# Patient Record
Sex: Female | Born: 1965 | Race: White | Hispanic: No | Marital: Married | Smoking: Never smoker
Health system: Southern US, Community
[De-identification: ages and names within clinical notes are randomized; demographics above are authoritative.]

## PROBLEM LIST (undated history)

## (undated) DIAGNOSIS — I10 Essential (primary) hypertension: Secondary | ICD-10-CM

## (undated) DIAGNOSIS — B977 Papillomavirus as the cause of diseases classified elsewhere: Secondary | ICD-10-CM

## (undated) DIAGNOSIS — A630 Anogenital (venereal) warts: Secondary | ICD-10-CM

## (undated) DIAGNOSIS — C801 Malignant (primary) neoplasm, unspecified: Secondary | ICD-10-CM

## (undated) DIAGNOSIS — K649 Unspecified hemorrhoids: Secondary | ICD-10-CM

## (undated) HISTORY — DX: Anogenital (venereal) warts: A63.0

## (undated) HISTORY — DX: Papillomavirus as the cause of diseases classified elsewhere: B97.7

## (undated) HISTORY — DX: Unspecified hemorrhoids: K64.9

## (undated) HISTORY — PX: WISDOM TOOTH EXTRACTION: SHX21

## (undated) HISTORY — DX: Essential (primary) hypertension: I10

## (undated) HISTORY — PX: COLONOSCOPY: SHX174

---

## 2005-05-25 ENCOUNTER — Ambulatory Visit: Payer: Self-pay

## 2006-08-01 ENCOUNTER — Ambulatory Visit: Payer: Self-pay

## 2006-08-09 ENCOUNTER — Ambulatory Visit: Payer: Self-pay

## 2007-10-24 ENCOUNTER — Ambulatory Visit: Payer: Self-pay

## 2009-03-04 ENCOUNTER — Ambulatory Visit: Payer: Self-pay

## 2010-04-07 ENCOUNTER — Ambulatory Visit: Payer: Self-pay

## 2011-06-22 ENCOUNTER — Ambulatory Visit: Payer: Self-pay

## 2012-07-04 ENCOUNTER — Ambulatory Visit: Payer: Self-pay

## 2013-08-07 ENCOUNTER — Ambulatory Visit: Payer: Self-pay

## 2014-12-10 ENCOUNTER — Ambulatory Visit
Admit: 2014-12-10 | Disposition: A | Discharge status: Home / Self Care | Payer: Self-pay | Attending: Obstetrics and Gynecology | Admitting: Obstetrics and Gynecology

## 2016-02-11 DIAGNOSIS — I10 Essential (primary) hypertension: Secondary | ICD-10-CM | POA: Insufficient documentation

## 2016-12-19 ENCOUNTER — Other Ambulatory Visit: Payer: Self-pay | Admitting: Obstetrics and Gynecology

## 2016-12-19 DIAGNOSIS — Z1231 Encounter for screening mammogram for malignant neoplasm of breast: Secondary | ICD-10-CM

## 2017-02-01 ENCOUNTER — Ambulatory Visit
Admission: RE | Admit: 2017-02-01 | Discharge: 2017-02-01 | Disposition: A | Discharge status: Home / Self Care | Payer: Managed Care, Other (non HMO) | Source: Ambulatory Visit | Attending: Obstetrics and Gynecology | Admitting: Obstetrics and Gynecology

## 2017-02-01 ENCOUNTER — Encounter: Payer: Self-pay | Admitting: Obstetrics and Gynecology

## 2017-02-01 DIAGNOSIS — Z1231 Encounter for screening mammogram for malignant neoplasm of breast: Secondary | ICD-10-CM | POA: Diagnosis not present

## 2017-03-05 HISTORY — PX: COLONOSCOPY: SHX174

## 2017-09-05 ENCOUNTER — Other Ambulatory Visit: Payer: Self-pay

## 2017-09-05 ENCOUNTER — Telehealth: Payer: Self-pay | Admitting: Obstetrics and Gynecology

## 2017-09-05 MED ORDER — NORETHINDRONE 0.35 MG PO TABS: 1.0000 | ORAL_TABLET | Freq: Every day | ORAL | 0 refills | Status: DC

## 2017-09-05 NOTE — Telephone Encounter (Signed)
Done

## 2017-09-05 NOTE — Telephone Encounter (Signed)
Patient needs refill on bc, she will run out by the end of the week, she is scheduled for annual 2/28.  [facility] [place].  She is also requesting 3 month Rx as it is cheaper for her.

## 2017-09-25 ENCOUNTER — Other Ambulatory Visit: Payer: Self-pay | Admitting: Obstetrics and Gynecology

## 2017-09-25 MED ORDER — NORETHINDRONE 0.35 MG PO TABS: 1.0000 | ORAL_TABLET | Freq: Every day | ORAL | 0 refills | Status: DC

## 2017-09-27 ENCOUNTER — Other Ambulatory Visit: Payer: Self-pay

## 2017-09-27 MED ORDER — NORETHINDRONE 0.35 MG PO TABS: 1.0000 | ORAL_TABLET | Freq: Every day | ORAL | 0 refills | Status: DC

## 2017-09-27 NOTE — Telephone Encounter (Signed)
Done

## 2017-09-27 NOTE — Telephone Encounter (Signed)
Patient's annual is rescheduled from 2/28 due to our office to 3/28.  Patient does not have enough of her birth control to last until this appointment.  Please call in three month prescription.  She has a week left now.  cb [phone].  [facility] [place]

## 2017-10-25 ENCOUNTER — Ambulatory Visit: Payer: Self-pay | Admitting: Obstetrics and Gynecology

## 2017-11-22 ENCOUNTER — Ambulatory Visit: Payer: Self-pay | Admitting: Obstetrics and Gynecology

## 2017-11-22 ENCOUNTER — Ambulatory Visit (INDEPENDENT_AMBULATORY_CARE_PROVIDER_SITE_OTHER): Payer: Managed Care, Other (non HMO) | Admitting: Obstetrics and Gynecology

## 2017-11-22 ENCOUNTER — Encounter: Payer: Self-pay | Admitting: Obstetrics and Gynecology

## 2017-11-22 VITALS — BP 120/80 | Ht 67.0 in | Wt 192.0 lb

## 2017-11-22 DIAGNOSIS — Z3041 Encounter for surveillance of contraceptive pills: Secondary | ICD-10-CM

## 2017-11-22 DIAGNOSIS — Z1239 Encounter for other screening for malignant neoplasm of breast: Secondary | ICD-10-CM

## 2017-11-22 DIAGNOSIS — Z01419 Encounter for gynecological examination (general) (routine) without abnormal findings: Secondary | ICD-10-CM | POA: Diagnosis not present

## 2017-11-22 DIAGNOSIS — Z1231 Encounter for screening mammogram for malignant neoplasm of breast: Secondary | ICD-10-CM

## 2017-11-22 MED ORDER — NORETHINDRONE 0.35 MG PO TABS: 1.0000 | ORAL_TABLET | Freq: Every day | ORAL | 3 refills | Status: DC

## 2017-11-22 NOTE — Progress Notes (Signed)
PCP: Deborah Chalk, FNP   Chief Complaint  Patient presents with  . Gynecologic Exam    HPI:      Ms. Ruth Bennett is a 52 y.o. G0P0000 who LMP was No LMP recorded., presents today for her annual examination.  Her menses are infrequent, light, lasting a few days on OCPs.  Dysmenorrhea none. She does not have intermenstrual bleeding.  Sex activity: single partner, contraception - oral progesterone-only contraceptive. She does not have vaginal dryness.  Last Pap: October 05, 2016  Results were: no abnormalities /neg HPV DNA.  Hx of STDs: HPV on pap 2017  Last mammogram: February 01, 2017  Results were: normal--routine follow-up in 12 months There is no FH of breast cancer. There is no FH of ovarian cancer. The patient does do self-breast exams.  Colonoscopy: colonoscopy 1 years ago without abnormalities.  Repeat due after 5 years.   Tobacco use: The patient denies current or previous tobacco use. Alcohol use: none Exercise: moderately active  She does get adequate calcium and Vitamin D in her diet.  Labs with PCP.   Past Medical History:  Diagnosis Date  . Genital warts   . Hemorrhoids   . HPV (human papilloma virus) infection   . Hypertension     Past Surgical History:  Procedure Laterality Date  . COLONOSCOPY    . WISDOM TOOTH EXTRACTION      Family History  Problem Relation Age of Onset  . Transient ischemic attack Mother   . Hypertension Father   . Hypertension Sister   . Thyroid cancer Sister   . Colon cancer Paternal Grandmother   . Stomach cancer Paternal Grandmother     Social History   Socioeconomic History  . Marital status: Married    Spouse name: Not on file  . Number of children: Not on file  . Years of education: Not on file  . Highest education level: Not on file  Occupational History  . Not on file  Social Needs  . Financial resource strain: Not on file  . Food insecurity:    Worry: Not on file    Inability: Not on file  .  Transportation needs:    Medical: Not on file    Non-medical: Not on file  Tobacco Use  . Smoking status: Never Smoker  . Smokeless tobacco: Never Used  Substance and Sexual Activity  . Alcohol use: Yes  . Drug use: Never  . Sexual activity: Yes    Birth control/protection: Pill  Lifestyle  . Physical activity:    Days per week: Not on file    Minutes per session: Not on file  . Stress: Not on file  Relationships  . Social connections:    Talks on phone: Not on file    Gets together: Not on file    Attends religious service: Not on file    Active member of club or organization: Not on file    Attends meetings of clubs or organizations: Not on file    Relationship status: Not on file  . Intimate partner violence:    Fear of current or ex partner: Not on file    Emotionally abused: Not on file    Physically abused: Not on file    Forced sexual activity: Not on file  Other Topics Concern  . Not on file  Social History Narrative  . Not on file    Outpatient Medications Prior to Visit  Medication Sig Dispense Refill  .  norethindrone (MICRONOR,CAMILA,ERRIN) 0.35 MG tablet Take 1 tablet (0.35 mg total) by mouth daily. 3 Package 0   No facility-administered medications prior to visit.      ROS:  Review of Systems  Constitutional: Negative for fatigue, fever and unexpected weight change.  Respiratory: Negative for cough, shortness of breath and wheezing.   Cardiovascular: Negative for chest pain, palpitations and leg swelling.  Gastrointestinal: Negative for blood in stool, constipation, diarrhea, nausea and vomiting.  Endocrine: Negative for cold intolerance, heat intolerance and polyuria.  Genitourinary: Negative for dyspareunia, dysuria, flank pain, frequency, genital sores, hematuria, menstrual problem, pelvic pain, urgency, vaginal bleeding, vaginal discharge and vaginal pain.  Musculoskeletal: Negative for back pain, joint swelling and myalgias.  Skin: Negative for  rash.  Neurological: Negative for dizziness, syncope, light-headedness, numbness and headaches.  Hematological: Negative for adenopathy.  Psychiatric/Behavioral: Negative for agitation, confusion, sleep disturbance and suicidal ideas. The patient is not nervous/anxious.    BREAST: No symptoms    Objective: BP 120/80   Ht 5\' 7"  (1.702 m)   Wt 192 lb (87.1 kg)   BMI 30.07 kg/m    Physical Exam  Constitutional: She is oriented to person, place, and time. She appears well-developed and well-nourished.  Genitourinary: Vagina normal and uterus normal. There is no rash or tenderness on the right labia. There is no rash or tenderness on the left labia. No erythema or tenderness in the vagina. No vaginal discharge found. Right adnexum does not display mass and does not display tenderness. Left adnexum does not display mass and does not display tenderness. Cervix does not exhibit motion tenderness or polyp. Uterus is not enlarged or tender.  Neck: Normal range of motion. No thyromegaly present.  Cardiovascular: Normal rate, regular rhythm and normal heart sounds.  No murmur heard. Pulmonary/Chest: Effort normal and breath sounds normal. Right breast exhibits no mass, no nipple discharge, no skin change and no tenderness. Left breast exhibits no mass, no nipple discharge, no skin change and no tenderness.  Abdominal: Soft. There is no tenderness. There is no guarding.  Musculoskeletal: Normal range of motion.  Neurological: She is alert and oriented to person, place, and time. No cranial nerve deficit.  Psychiatric: She has a normal mood and affect. Her behavior is normal.  Vitals reviewed.   Assessment/Plan:  Encounter for annual routine gynecological examination  Screening for breast cancer - pt to sched mammo. - Plan: MM DIGITAL SCREENING BILATERAL  Encounter for surveillance of contraceptive pills - Rx RF camila. Still cycling occas. Re-eval in 1 yr. - Plan: norethindrone  (MICRONOR,CAMILA,ERRIN) 0.35 MG tablet   Meds ordered this encounter  Medications  . norethindrone (MICRONOR,CAMILA,ERRIN) 0.35 MG tablet    Sig: Take 1 tablet (0.35 mg total) by mouth daily.    Dispense:  3 Package    Refill:  3    Order Specific Question:   Supervising Provider    Answer:   Gae Dry [[id number]]           GYN counsel breast self exam, mammography screening, menopause, adequate intake of calcium and vitamin D, diet and exercise    F/U  No follow-ups on file.  Keara Pagliarulo B. Kern Gingras, PA-C 11/22/2017 3:42 PM

## 2017-11-22 NOTE — Patient Instructions (Addendum)
I value your feedback and entrusting us with your care. If you get a [facility] patient survey, I would appreciate you taking the time to let us know about your experience today. Thank you!  [facility]: [phone]    

## 2017-12-17 ENCOUNTER — Encounter: Payer: Self-pay | Admitting: Obstetrics and Gynecology

## 2017-12-17 ENCOUNTER — Telehealth: Payer: Self-pay

## 2017-12-17 NOTE — Telephone Encounter (Signed)
Called pt to let her know [facility] [place] to inquire about the pt saying there is no prescription to be picked up[facility]. I let her know in the voicemail to call if there was any trouble with trying to get that prescription picked up.

## 2018-05-02 ENCOUNTER — Encounter (HOSPITAL_COMMUNITY): Payer: Self-pay

## 2018-05-02 ENCOUNTER — Ambulatory Visit
Admission: RE | Admit: 2018-05-02 | Discharge: 2018-05-02 | Disposition: A | Discharge status: Home / Self Care | Payer: Managed Care, Other (non HMO) | Source: Ambulatory Visit | Attending: Obstetrics and Gynecology | Admitting: Obstetrics and Gynecology

## 2018-05-02 DIAGNOSIS — Z1231 Encounter for screening mammogram for malignant neoplasm of breast: Secondary | ICD-10-CM | POA: Diagnosis not present

## 2018-05-02 DIAGNOSIS — Z1239 Encounter for other screening for malignant neoplasm of breast: Secondary | ICD-10-CM

## 2018-05-02 HISTORY — DX: Malignant (primary) neoplasm, unspecified: C80.1

## 2018-05-05 ENCOUNTER — Encounter: Payer: Self-pay | Admitting: Obstetrics and Gynecology

## 2018-11-06 ENCOUNTER — Other Ambulatory Visit: Payer: Self-pay | Admitting: Obstetrics and Gynecology

## 2018-11-06 DIAGNOSIS — Z3041 Encounter for surveillance of contraceptive pills: Secondary | ICD-10-CM

## 2018-11-08 ENCOUNTER — Other Ambulatory Visit: Payer: Self-pay | Admitting: Obstetrics and Gynecology

## 2018-11-08 DIAGNOSIS — Z3041 Encounter for surveillance of contraceptive pills: Secondary | ICD-10-CM

## 2018-11-14 ENCOUNTER — Other Ambulatory Visit: Payer: Self-pay

## 2018-11-14 ENCOUNTER — Telehealth: Payer: Self-pay | Admitting: Obstetrics and Gynecology

## 2018-11-14 DIAGNOSIS — Z3041 Encounter for surveillance of contraceptive pills: Secondary | ICD-10-CM

## 2018-11-14 MED ORDER — NORETHINDRONE 0.35 MG PO TABS: 1.0000 | ORAL_TABLET | Freq: Every day | ORAL | 0 refills | Status: DC

## 2018-11-14 NOTE — Telephone Encounter (Signed)
Patient is messaging thru mychart needing to reschedule to late May and is needing refills on Birthcontrol to get her to her next schedule appointment on 01/16/19.

## 2018-11-14 NOTE — Telephone Encounter (Signed)
RF sent.

## 2018-11-28 ENCOUNTER — Ambulatory Visit: Payer: Managed Care, Other (non HMO) | Admitting: Obstetrics and Gynecology

## 2019-01-15 ENCOUNTER — Ambulatory Visit: Payer: Managed Care, Other (non HMO) | Admitting: Obstetrics and Gynecology

## 2019-01-16 ENCOUNTER — Ambulatory Visit: Payer: Managed Care, Other (non HMO) | Admitting: Obstetrics and Gynecology

## 2019-02-04 ENCOUNTER — Other Ambulatory Visit: Payer: Self-pay | Admitting: Obstetrics and Gynecology

## 2019-02-04 DIAGNOSIS — Z3041 Encounter for surveillance of contraceptive pills: Secondary | ICD-10-CM

## 2019-02-12 ENCOUNTER — Ambulatory Visit: Payer: Managed Care, Other (non HMO) | Admitting: Obstetrics and Gynecology

## 2019-03-20 ENCOUNTER — Ambulatory Visit: Payer: Managed Care, Other (non HMO) | Admitting: Obstetrics and Gynecology

## 2019-04-23 ENCOUNTER — Other Ambulatory Visit: Payer: Self-pay

## 2019-04-23 ENCOUNTER — Other Ambulatory Visit (HOSPITAL_COMMUNITY)
Admission: RE | Admit: 2019-04-23 | Discharge: 2019-04-23 | Disposition: A | Discharge status: Home / Self Care | Payer: Managed Care, Other (non HMO) | Source: Ambulatory Visit | Attending: Obstetrics and Gynecology | Admitting: Obstetrics and Gynecology

## 2019-04-23 ENCOUNTER — Ambulatory Visit (INDEPENDENT_AMBULATORY_CARE_PROVIDER_SITE_OTHER): Payer: Managed Care, Other (non HMO) | Admitting: Obstetrics and Gynecology

## 2019-04-23 ENCOUNTER — Encounter: Payer: Self-pay | Admitting: Obstetrics and Gynecology

## 2019-04-23 VITALS — BP 130/100 | Ht 67.0 in | Wt 195.6 lb

## 2019-04-23 DIAGNOSIS — Z1239 Encounter for other screening for malignant neoplasm of breast: Secondary | ICD-10-CM

## 2019-04-23 DIAGNOSIS — Z124 Encounter for screening for malignant neoplasm of cervix: Secondary | ICD-10-CM | POA: Insufficient documentation

## 2019-04-23 DIAGNOSIS — Z8742 Personal history of other diseases of the female genital tract: Secondary | ICD-10-CM

## 2019-04-23 DIAGNOSIS — Z01419 Encounter for gynecological examination (general) (routine) without abnormal findings: Secondary | ICD-10-CM | POA: Diagnosis not present

## 2019-04-23 DIAGNOSIS — Z1151 Encounter for screening for human papillomavirus (HPV): Secondary | ICD-10-CM | POA: Diagnosis present

## 2019-04-23 DIAGNOSIS — N951 Menopausal and female climacteric states: Secondary | ICD-10-CM

## 2019-04-23 NOTE — Progress Notes (Signed)
PCP: Deborah Chalk, FNP   Chief Complaint  Patient presents with  . Gynecologic Exam    HPI:      Ms. Ruth Bennett is a 53 y.o. G0P0000 who LMP was No LMP recorded. (Menstrual status: Oral contraceptives)., presents today for her annual examination.  Her menses are infrequent,spotting only, a few days on POPs. Started [facility] for menorrhagia in past. Dysmenorrhea none. She does not have intermenstrual bleeding.  Sex activity: single partner, contraception - oral progesterone-only contraceptive. She does have vaginal dryness, improved with lubricants.  Last Pap: October 05, 2016  Results were: no abnormalities /neg HPV DNA.  Hx of STDs: HPV on pap 2017  Last mammogram: 05/02/18  Results were: normal--routine follow-up in 12 months There is no FH of breast cancer. There is no FH of ovarian cancer. The patient does do self-breast exams.  Colonoscopy: colonoscopy 2 years ago without abnormalities.  Repeat due after 5 years. FH colon cancer in her PGM.  Tobacco use: The patient denies current or previous tobacco use. Alcohol use: social Drug use: none Exercise: moderately active  She does get adequate calcium and Vitamin D in her diet.  Labs with PCP.   Past Medical History:  Diagnosis Date  . Cancer ([place])    skin  . Genital warts   . Hemorrhoids   . HPV (human papilloma virus) infection   . Hypertension     Past Surgical History:  Procedure Laterality Date  . COLONOSCOPY    . WISDOM TOOTH EXTRACTION      Family History  Problem Relation Age of Onset  . Transient ischemic attack Mother   . Hypertension Father   . Hypertension Sister   . Thyroid cancer Sister   . Colon cancer Paternal Grandmother   . Stomach cancer Paternal Grandmother   . Breast cancer Neg Hx     Social History   Socioeconomic History  . Marital status: Married    Spouse name: Not on file  . Number of children: Not on file  . Years of education: Not on file  . Highest education  level: Not on file  Occupational History  . Not on file  Social Needs  . Financial resource strain: Not on file  . Food insecurity    Worry: Not on file    Inability: Not on file  . Transportation needs    Medical: Not on file    Non-medical: Not on file  Tobacco Use  . Smoking status: Never Smoker  . Smokeless tobacco: Never Used  Substance and Sexual Activity  . Alcohol use: Yes  . Drug use: Never  . Sexual activity: Yes    Birth control/protection: Pill  Lifestyle  . Physical activity    Days per week: Not on file    Minutes per session: Not on file  . Stress: Not on file  Relationships  . Social Herbalist on phone: Not on file    Gets together: Not on file    Attends religious service: Not on file    Active member of club or organization: Not on file    Attends meetings of clubs or organizations: Not on file    Relationship status: Not on file  . Intimate partner violence    Fear of current or ex partner: Not on file    Emotionally abused: Not on file    Physically abused: Not on file    Forced sexual activity: Not on file  Other  Topics Concern  . Not on file  Social History Narrative  . Not on file    Outpatient Medications Prior to Visit  Medication Sig Dispense Refill  . metoprolol succinate (TOPROL-XL) 25 MG 24 hr tablet TAKE 1 TABLET BY MOUTH DAILY    . norethindrone (MICRONOR) 0.35 MG tablet TAKE 1 TABLET BY MOUTH ONCE A DAY 84 tablet 0   No facility-administered medications prior to visit.      ROS:  Review of Systems  Constitutional: Negative for fatigue, fever and unexpected weight change.  Respiratory: Negative for cough, shortness of breath and wheezing.   Cardiovascular: Negative for chest pain, palpitations and leg swelling.  Gastrointestinal: Negative for blood in stool, constipation, diarrhea, nausea and vomiting.  Endocrine: Negative for cold intolerance, heat intolerance and polyuria.  Genitourinary: Negative for dyspareunia,  dysuria, flank pain, frequency, genital sores, hematuria, menstrual problem, pelvic pain, urgency, vaginal bleeding, vaginal discharge and vaginal pain.  Musculoskeletal: Negative for back pain, joint swelling and myalgias.  Skin: Negative for rash.  Neurological: Negative for dizziness, syncope, light-headedness, numbness and headaches.  Hematological: Negative for adenopathy.  Psychiatric/Behavioral: Negative for agitation, confusion, sleep disturbance and suicidal ideas. The patient is not nervous/anxious.   BREAST: No symptoms    Objective: BP (!) 130/100   Ht 5\' 7"  (1.702 m)   Wt 195 lb 9.6 oz (88.7 kg)   BMI 30.64 kg/m    Physical Exam Constitutional:      Appearance: She is well-developed.  Genitourinary:     Vulva, vagina, uterus, right adnexa and left adnexa normal.     No vulval lesion or tenderness noted.     No vaginal discharge, erythema or tenderness.     No cervical motion tenderness or polyp.     Uterus is not enlarged or tender.     No right or left adnexal mass present.     Right adnexa not tender.     Left adnexa not tender.  Neck:     Musculoskeletal: Normal range of motion.     Thyroid: No thyromegaly.  Cardiovascular:     Rate and Rhythm: Normal rate and regular rhythm.     Heart sounds: Normal heart sounds. No murmur.  Pulmonary:     Effort: Pulmonary effort is normal.     Breath sounds: Normal breath sounds.  Chest:     Breasts:        Right: No mass, nipple discharge, skin change or tenderness.        Left: No mass, nipple discharge, skin change or tenderness.  Abdominal:     Palpations: Abdomen is soft.     Tenderness: There is no abdominal tenderness. There is no guarding.  Musculoskeletal: Normal range of motion.  Neurological:     General: No focal deficit present.     Mental Status: She is alert and oriented to person, place, and time.     Cranial Nerves: No cranial nerve deficit.  Skin:    General: Skin is warm and dry.  Psychiatric:         Mood and Affect: Mood normal.        Behavior: Behavior normal.        Thought Content: Thought content normal.        Judgment: Judgment normal.  Vitals signs reviewed.     Assessment/Plan:  Encounter for annual routine gynecological examination  Cervical cancer screening - Plan: Cytology - PAP  Screening for HPV (human papillomavirus) - Plan: Cytology - PAP  History of abnormal cervical Pap smear - Plan: Cytology - PAP  Screening for breast cancer - Plan: MM 3D SCREEN BREAST BILATERAL--Pt to sched mammo  Perimenopause--Discussed stopping POPs since bleeding so infrequent. F/u prn DUB or menorrhagia for POP restart.          GYN counsel breast self exam, mammography screening, menopause, adequate intake of calcium and vitamin D, diet and exercise    F/U  Return in about 1 year (around 04/22/2020).  Kayana Thoen B. Darien Kading, PA-C 04/23/2019 2:00 PM

## 2019-04-23 NOTE — Patient Instructions (Addendum)
I value your feedback and entrusting us with your care. If you get a [facility] patient survey, I would appreciate you taking the time to let us know about your experience today. Thank you!  [facility]: [phone]    

## 2019-04-28 LAB — CYTOLOGY - PAP
Adequacy: ABSENT
Diagnosis: NEGATIVE
HPV: NOT DETECTED

## 2019-05-13 ENCOUNTER — Other Ambulatory Visit: Payer: Self-pay | Admitting: Obstetrics and Gynecology

## 2019-05-13 DIAGNOSIS — Z3041 Encounter for surveillance of contraceptive pills: Secondary | ICD-10-CM

## 2019-05-13 MED ORDER — NORETHINDRONE 0.35 MG PO TABS: 1.0000 | ORAL_TABLET | Freq: Every day | ORAL | 0 refills | Status: DC

## 2019-05-13 NOTE — Telephone Encounter (Signed)
Please see

## 2019-05-14 ENCOUNTER — Other Ambulatory Visit: Payer: Self-pay | Admitting: Obstetrics and Gynecology

## 2019-05-14 ENCOUNTER — Encounter: Payer: Self-pay | Admitting: Obstetrics and Gynecology

## 2019-05-14 DIAGNOSIS — Z3041 Encounter for surveillance of contraceptive pills: Secondary | ICD-10-CM

## 2019-06-04 ENCOUNTER — Other Ambulatory Visit: Payer: Self-pay | Admitting: Obstetrics and Gynecology

## 2019-06-04 DIAGNOSIS — Z3041 Encounter for surveillance of contraceptive pills: Secondary | ICD-10-CM

## 2019-10-09 ENCOUNTER — Encounter: Payer: Self-pay | Admitting: Obstetrics and Gynecology

## 2019-10-09 ENCOUNTER — Ambulatory Visit
Admission: RE | Admit: 2019-10-09 | Discharge: 2019-10-09 | Disposition: A | Discharge status: Home / Self Care | Payer: Managed Care, Other (non HMO) | Source: Ambulatory Visit | Attending: Obstetrics and Gynecology | Admitting: Obstetrics and Gynecology

## 2019-10-09 DIAGNOSIS — Z1239 Encounter for other screening for malignant neoplasm of breast: Secondary | ICD-10-CM

## 2019-10-09 DIAGNOSIS — Z1231 Encounter for screening mammogram for malignant neoplasm of breast: Secondary | ICD-10-CM | POA: Diagnosis present

## 2019-12-15 ENCOUNTER — Encounter: Payer: Self-pay | Admitting: Obstetrics and Gynecology

## 2020-02-05 DIAGNOSIS — K219 Gastro-esophageal reflux disease without esophagitis: Secondary | ICD-10-CM | POA: Insufficient documentation

## 2020-06-16 ENCOUNTER — Other Ambulatory Visit: Payer: Self-pay | Admitting: Obstetrics and Gynecology

## 2020-06-16 DIAGNOSIS — Z1231 Encounter for screening mammogram for malignant neoplasm of breast: Secondary | ICD-10-CM

## 2020-08-11 NOTE — Progress Notes (Signed)
PCP: Deborah Chalk, FNP   Chief Complaint  Patient presents with  . Gynecologic Exam    No concerns    HPI:      Ms. Ruth Bennett is a 54 y.o. G0P0000 who LMP was No LMP recorded (lmp unknown). (Menstrual status: Oral contraceptives)., presents today for her annual examination.  Her menses are absent since stopping POPs last yr. No BTB, no PMB. No dysmen.  Sex activity: single partner, contraception - post menopause. She does have vaginal dryness, improved with lubricants/coconut oil.  Last Pap: 04/23/19  Results were: no abnormalities /neg HPV DNA. Due next yr Hx of STDs: HPV on pap 2017  Last mammogram: 10/09/19  Results were: normal--routine follow-up in 12 months. Has appt sched 2/22 There is no FH of breast cancer. There is no FH of ovarian cancer. The patient does do self-breast exams.  Colonoscopy: colonoscopy 3 years ago without abnormalities.  Repeat due after 5 years. FH colon cancer in her PGM.  Tobacco use: The patient denies current or previous tobacco use. Alcohol use: social Drug use: none Exercise: moderately active  She does get adequate calcium and Vitamin D in her diet.  Labs with PCP.   Past Medical History:  Diagnosis Date  . Cancer ([place])    skin  . Genital warts   . Hemorrhoids   . HPV (human papilloma virus) infection   . Hypertension     Past Surgical History:  Procedure Laterality Date  . COLONOSCOPY    . WISDOM TOOTH EXTRACTION      Family History  Problem Relation Age of Onset  . Transient ischemic attack Mother   . Hypertension Father   . Hypertension Sister   . Thyroid cancer Sister   . Colon cancer Paternal Grandmother   . Stomach cancer Paternal Grandmother   . Breast cancer Neg Hx     Social History   Socioeconomic History  . Marital status: Married    Spouse name: Not on file  . Number of children: Not on file  . Years of education: Not on file  . Highest education level: Not on file  Occupational History   . Not on file  Tobacco Use  . Smoking status: Never Smoker  . Smokeless tobacco: Never Used  Vaping Use  . Vaping Use: Never used  Substance and Sexual Activity  . Alcohol use: Yes  . Drug use: Never  . Sexual activity: Yes    Birth control/protection: None  Other Topics Concern  . Not on file  Social History Narrative  . Not on file   Social Determinants of Health   Financial Resource Strain: Not on file  Food Insecurity: Not on file  Transportation Needs: Not on file  Physical Activity: Not on file  Stress: Not on file  Social Connections: Not on file  Intimate Partner Violence: Not on file    Outpatient Medications Prior to Visit  Medication Sig Dispense Refill  . metoprolol succinate (TOPROL-XL) 50 MG 24 hr tablet Take 50 mg by mouth daily.    . metoprolol succinate (TOPROL-XL) 25 MG 24 hr tablet TAKE 1 TABLET BY MOUTH DAILY    . norethindrone (MICRONOR) 0.35 MG tablet Take 1 tablet (0.35 mg total) by mouth daily. 28 tablet 0   No facility-administered medications prior to visit.     ROS:  Review of Systems  Constitutional: Negative for fatigue, fever and unexpected weight change.  Respiratory: Negative for cough, shortness of breath and wheezing.  Cardiovascular: Negative for chest pain, palpitations and leg swelling.  Gastrointestinal: Negative for blood in stool, constipation, diarrhea, nausea and vomiting.  Endocrine: Negative for cold intolerance, heat intolerance and polyuria.  Genitourinary: Negative for dyspareunia, dysuria, flank pain, frequency, genital sores, hematuria, menstrual problem, pelvic pain, urgency, vaginal bleeding, vaginal discharge and vaginal pain.  Musculoskeletal: Negative for back pain, joint swelling and myalgias.  Skin: Negative for rash.  Neurological: Negative for dizziness, syncope, light-headedness, numbness and headaches.  Hematological: Negative for adenopathy.  Psychiatric/Behavioral: Negative for agitation, confusion,  sleep disturbance and suicidal ideas. The patient is not nervous/anxious.   BREAST: No symptoms    Objective: BP 126/80   Ht 5\' 7"  (1.702 m)   Wt 199 lb (90.3 kg)   LMP  (LMP Unknown)   BMI 31.17 kg/m    Physical Exam Constitutional:      Appearance: She is well-developed.  Genitourinary:     Vulva normal.     Right Labia: No rash, tenderness or lesions.    Left Labia: No tenderness, lesions or rash.    No vaginal discharge, erythema or tenderness.      Right Adnexa: not tender and no mass present.    Left Adnexa: not tender and no mass present.    No cervical motion tenderness, friability or polyp.     Uterus is not enlarged or tender.  Breasts:     Right: No mass, nipple discharge, skin change or tenderness.     Left: No mass, nipple discharge, skin change or tenderness.    Neck:     Thyroid: No thyromegaly.  Cardiovascular:     Rate and Rhythm: Normal rate and regular rhythm.     Heart sounds: Normal heart sounds. No murmur heard.   Pulmonary:     Effort: Pulmonary effort is normal.     Breath sounds: Normal breath sounds.  Abdominal:     Palpations: Abdomen is soft.     Tenderness: There is no abdominal tenderness. There is no guarding or rebound.  Musculoskeletal:        General: Normal range of motion.     Cervical back: Normal range of motion.  Lymphadenopathy:     Cervical: No cervical adenopathy.  Neurological:     General: No focal deficit present.     Mental Status: She is alert and oriented to person, place, and time.     Cranial Nerves: No cranial nerve deficit.  Skin:    General: Skin is warm and dry.  Psychiatric:        Mood and Affect: Mood normal.        Behavior: Behavior normal.        Thought Content: Thought content normal.        Judgment: Judgment normal.  Vitals reviewed.     Assessment/Plan:  Encounter for annual routine gynecological examination  History of abnormal cervical Pap smear--repeat pap due next yr  Encounter  for screening mammogram for malignant neoplasm of breast--pt has appt sched 2/22  Postmenopause--no bleeding since stopping POPs. F/u prn PMB.          GYN counsel breast self exam, mammography screening, menopause, adequate intake of calcium and vitamin D, diet and exercise    F/U  Return in about 1 year (around 08/12/2021).  Celso Granja B. Tuan Tippin, PA-C 08/12/2020 2:21 PM

## 2020-08-11 NOTE — Patient Instructions (Signed)
I value your feedback and entrusting us with your care. If you get a [facility] patient survey, I would appreciate you taking the time to let us know about your experience today. Thank you!  As of August 07, 2019, your lab results will be released to your MyChart immediately, before I even have a chance to see them. Please give me time to review them and contact you if there are any abnormalities. Thank you for your patience.  

## 2020-08-12 ENCOUNTER — Ambulatory Visit (INDEPENDENT_AMBULATORY_CARE_PROVIDER_SITE_OTHER): Payer: Managed Care, Other (non HMO) | Admitting: Obstetrics and Gynecology

## 2020-08-12 ENCOUNTER — Encounter: Payer: Self-pay | Admitting: Obstetrics and Gynecology

## 2020-08-12 ENCOUNTER — Other Ambulatory Visit: Payer: Self-pay

## 2020-08-12 VITALS — BP 126/80 | Ht 67.0 in | Wt 199.0 lb

## 2020-08-12 DIAGNOSIS — Z01419 Encounter for gynecological examination (general) (routine) without abnormal findings: Secondary | ICD-10-CM

## 2020-08-12 DIAGNOSIS — Z780 Asymptomatic menopausal state: Secondary | ICD-10-CM | POA: Diagnosis not present

## 2020-08-12 DIAGNOSIS — Z1231 Encounter for screening mammogram for malignant neoplasm of breast: Secondary | ICD-10-CM | POA: Diagnosis not present

## 2020-08-12 DIAGNOSIS — Z8742 Personal history of other diseases of the female genital tract: Secondary | ICD-10-CM | POA: Diagnosis not present

## 2020-12-09 ENCOUNTER — Ambulatory Visit
Admission: RE | Admit: 2020-12-09 | Discharge: 2020-12-09 | Disposition: A | Discharge status: Home / Self Care | Payer: Managed Care, Other (non HMO) | Source: Ambulatory Visit | Attending: Obstetrics and Gynecology | Admitting: Obstetrics and Gynecology

## 2020-12-09 ENCOUNTER — Other Ambulatory Visit: Payer: Self-pay

## 2020-12-09 DIAGNOSIS — Z1231 Encounter for screening mammogram for malignant neoplasm of breast: Secondary | ICD-10-CM | POA: Insufficient documentation

## 2021-11-02 ENCOUNTER — Encounter: Payer: Self-pay | Admitting: Obstetrics and Gynecology

## 2021-11-07 NOTE — Telephone Encounter (Signed)
Patient is scheduled for 11/08/21 with [facility]

## 2021-11-08 ENCOUNTER — Encounter: Payer: Self-pay | Admitting: Obstetrics and Gynecology

## 2021-11-08 ENCOUNTER — Ambulatory Visit (INDEPENDENT_AMBULATORY_CARE_PROVIDER_SITE_OTHER): Payer: Managed Care, Other (non HMO) | Admitting: Obstetrics and Gynecology

## 2021-11-08 ENCOUNTER — Other Ambulatory Visit (HOSPITAL_COMMUNITY)
Admission: RE | Admit: 2021-11-08 | Discharge: 2021-11-08 | Disposition: A | Discharge status: Home / Self Care | Payer: Managed Care, Other (non HMO) | Source: Ambulatory Visit | Attending: Obstetrics and Gynecology | Admitting: Obstetrics and Gynecology

## 2021-11-08 ENCOUNTER — Other Ambulatory Visit: Payer: Self-pay

## 2021-11-08 VITALS — BP 112/70 | Ht 67.0 in | Wt 207.0 lb

## 2021-11-08 DIAGNOSIS — N952 Postmenopausal atrophic vaginitis: Secondary | ICD-10-CM

## 2021-11-08 DIAGNOSIS — Z124 Encounter for screening for malignant neoplasm of cervix: Secondary | ICD-10-CM | POA: Diagnosis present

## 2021-11-08 DIAGNOSIS — Z01419 Encounter for gynecological examination (general) (routine) without abnormal findings: Secondary | ICD-10-CM | POA: Diagnosis not present

## 2021-11-08 DIAGNOSIS — Z1211 Encounter for screening for malignant neoplasm of colon: Secondary | ICD-10-CM

## 2021-11-08 DIAGNOSIS — Z1231 Encounter for screening mammogram for malignant neoplasm of breast: Secondary | ICD-10-CM

## 2021-11-08 DIAGNOSIS — Z1151 Encounter for screening for human papillomavirus (HPV): Secondary | ICD-10-CM | POA: Insufficient documentation

## 2021-11-08 DIAGNOSIS — N898 Other specified noninflammatory disorders of vagina: Secondary | ICD-10-CM

## 2021-11-08 MED ORDER — ESTRADIOL 0.1 MG/GM VA CREA: TOPICAL_CREAM | VAGINAL | 1 refills | Status: DC

## 2021-11-08 NOTE — Progress Notes (Signed)
? ?PCP: Deborah Chalk, FNP ? ? ?Chief Complaint  ?Patient presents with  ? Gynecologic Exam  ? ? ?HPI: ?     Ms. Ruth Bennett is a 56 y.o. G0P0000 who LMP was No LMP recorded (lmp unknown). (Menstrual status: Irregular Periods)., presents today for her annual examination.  Her menses are absent due to menopause, no PMB. No dysmen. Improving vasomotor sx.  ? ?Sex activity: single partner, contraception - post menopause. She does have vaginal dryness, no longer improved with lubricants/coconut oil. ? ?Last Pap: 04/23/19  Results were: no abnormalities /neg HPV DNA. Due next yr ?Hx of STDs: HPV on pap 2017 ? ?Pt has had a bump in vulvar area for a couple months. Has had in past but it usually resolves quickly, this one hasn't gone away. Tried a warm compress without relief. Used mupirocin oint with some improvement. Pt concerned; lesion significantly improved today. Also noticed a new one LT buttocks near gluteal cleft yesterday, improved today. ? ?Last mammogram: 12/09/20 Results were: normal--routine follow-up in 12 months.  ?There is no FH of breast cancer. There is no FH of ovarian cancer. The patient does do self-breast exams. ? ?Colonoscopy: colonoscopy 4+ years ago without abnormalities.  Dr. Lyndel Safe in [place]. Repeat due after 5 years. FH colon cancer in her PGM. ? ?Tobacco use: The patient denies current or previous tobacco use. ?Alcohol use: social ?Drug use: none ?Exercise: moderately active ? ?She does get adequate calcium and Vitamin D in her diet. ? ?Labs with PCP. ? ? ?Past Medical History:  ?Diagnosis Date  ? Cancer [facility])   ? skin  ? Genital warts   ? Hemorrhoids   ? HPV (human papilloma virus) infection   ? Hypertension   ? ? ?Past Surgical History:  ?Procedure Laterality Date  ? COLONOSCOPY    ? WISDOM TOOTH EXTRACTION    ? ? ?Family History  ?Problem Relation Age of Onset  ? Transient ischemic attack Mother   ? Hypertension Father   ? Hypertension Sister   ? Thyroid cancer Sister   ? Colon  cancer Paternal Grandmother   ? Stomach cancer Paternal Grandmother   ? Breast cancer Neg Hx   ? ? ?Social History  ? ?Socioeconomic History  ? Marital status: Married  ?  Spouse name: Not on file  ? Number of children: Not on file  ? Years of education: Not on file  ? Highest education level: Not on file  ?Occupational History  ? Not on file  ?Tobacco Use  ? Smoking status: Never  ? Smokeless tobacco: Never  ?Vaping Use  ? Vaping Use: Never used  ?Substance and Sexual Activity  ? Alcohol use: Yes  ? Drug use: Never  ? Sexual activity: Yes  ?  Birth control/protection: None  ?Other Topics Concern  ? Not on file  ?Social History Narrative  ? Not on file  ? ?Social Determinants of Health  ? ?Financial Resource Strain: Not on file  ?Food Insecurity: Not on file  ?Transportation Needs: Not on file  ?Physical Activity: Not on file  ?Stress: Not on file  ?Social Connections: Not on file  ?Intimate Partner Violence: Not on file  ? ? ?Outpatient Medications Prior to Visit  ?Medication Sig Dispense Refill  ? metoprolol succinate (TOPROL-XL) 50 MG 24 hr tablet Take 50 mg by mouth daily.    ? mupirocin ointment (BACTROBAN) 2 % Apply topically 2 (two) times daily.    ? ?No facility-administered medications prior to  visit.  ? ? ? ?ROS: ? ?Review of Systems  ?Constitutional:  Negative for fatigue, fever and unexpected weight change.  ?Respiratory:  Negative for cough, shortness of breath and wheezing.   ?Cardiovascular:  Negative for chest pain, palpitations and leg swelling.  ?Gastrointestinal:  Negative for blood in stool, constipation, diarrhea, nausea and vomiting.  ?Endocrine: Negative for cold intolerance, heat intolerance and polyuria.  ?Genitourinary:  Positive for dyspareunia and genital sores. Negative for dysuria, flank pain, frequency, hematuria, menstrual problem, pelvic pain, urgency, vaginal bleeding, vaginal discharge and vaginal pain.  ?Musculoskeletal:  Negative for back pain, joint swelling and myalgias.   ?Skin:  Negative for rash.  ?Neurological:  Negative for dizziness, syncope, light-headedness, numbness and headaches.  ?Hematological:  Negative for adenopathy.  ?Psychiatric/Behavioral:  Negative for agitation, confusion, sleep disturbance and suicidal ideas. The patient is not nervous/anxious.  BREAST: No symptoms ? ? ? ?Objective: ?BP 112/70   Ht '5\' 7"'$  (1.702 m)   Wt 207 lb (93.9 kg)   LMP  (LMP Unknown)   BMI 32.42 kg/m?  ? ? ?Physical Exam ?Constitutional:   ?   Appearance: She is well-developed.  ?Genitourinary:  ?   Vulva normal.  ?   Right Labia: lesions.  ?   Right Labia: No rash or tenderness. ?   Left Labia: No tenderness, lesions or rash. ? ? ? ?   No vaginal discharge, erythema or tenderness.  ?   Mild vaginal atrophy present. ? ?   Right Adnexa: not tender and no mass present. ?   Left Adnexa: not tender and no mass present. ?   No cervical motion tenderness, friability or polyp.  ?   Uterus is not enlarged or tender.  ?Breasts: ?   Right: No mass, nipple discharge, skin change or tenderness.  ?   Left: No mass, nipple discharge, skin change or tenderness.  ?Neck:  ?   Thyroid: No thyromegaly.  ?Cardiovascular:  ?   Rate and Rhythm: Normal rate and regular rhythm.  ?   Heart sounds: Normal heart sounds. No murmur heard. ?Pulmonary:  ?   Effort: Pulmonary effort is normal.  ?   Breath sounds: Normal breath sounds.  ?Abdominal:  ?   Palpations: Abdomen is soft.  ?   Tenderness: There is no abdominal tenderness. There is no guarding or rebound.  ?Musculoskeletal:     ?   General: Normal range of motion.  ?   Cervical back: Normal range of motion.  ?Lymphadenopathy:  ?   Cervical: No cervical adenopathy.  ?Neurological:  ?   General: No focal deficit present.  ?   Mental Status: She is alert and oriented to person, place, and time.  ?   Cranial Nerves: No cranial nerve deficit.  ?Skin: ?   General: Skin is warm and dry.  ?Psychiatric:     ?   Mood and Affect: Mood normal.     ?   Behavior: Behavior  normal.     ?   Thought Content: Thought content normal.     ?   Judgment: Judgment normal.  ?Vitals reviewed.  ? ? ?Assessment/Plan: ?Encounter for annual routine gynecological examination ? ?Cervical cancer screening - Plan: Cytology - PAP ? ?Screening for HPV (human papillomavirus) - Plan: Cytology - PAP ? ?Encounter for screening mammogram for malignant neoplasm of breast - Plan: MM 3D SCREEN BREAST BILATERAL; pt to schedule mammo ? ?Screening for colon cancer--pt to f/u with GI to see when due for colonoscopy.  Will do ref prn.  ? ?Postmenopausal atrophic vaginitis - Plan: estradiol (ESTRACE) 0.1 MG/GM vaginal cream; try vag ERT for dyspareunia/atrophy. Cont coconut oil. Rx estrace crm. F/u prn.  ? ?Vaginal lesion--resolving, reassurance. Recommended warm compresses/mupirocin prn. F/u prn.  ? ?Meds ordered this encounter  ?Medications  ? estradiol (ESTRACE) 0.1 MG/GM vaginal cream  ?  Sig: Insert 1g vaginally nightly for 1 wk, then 1 g once weekly as maintenace  ?  Dispense:  42.5 g  ?  Refill:  1  ?  Order Specific Question:   Supervising Provider  ?  AnswerGae Dry [[id number]]  ? ? ?       GYN counsel breast self exam, mammography screening, menopause, adequate intake of calcium and vitamin D, diet and exercise ? ?  F/U ? Return in about 1 year (around 11/09/2022). ? ?Abel Hageman B. Cori Justus, PA-C ?11/08/2021 ?3:37 PM ?

## 2021-11-08 NOTE — Patient Instructions (Addendum)
I value your feedback and you entrusting us with your care. If you get a [facility] patient survey, I would appreciate you taking the time to let us know about your experience today. Thank you!  [facility]: [phone]      

## 2021-11-10 LAB — CYTOLOGY - PAP
Comment: NEGATIVE
Diagnosis: NEGATIVE
High risk HPV: NEGATIVE

## 2021-12-06 ENCOUNTER — Ambulatory Visit: Payer: Managed Care, Other (non HMO) | Admitting: Obstetrics and Gynecology

## 2022-01-05 ENCOUNTER — Ambulatory Visit
Admission: RE | Admit: 2022-01-05 | Discharge: 2022-01-05 | Disposition: A | Discharge status: Home / Self Care | Payer: Managed Care, Other (non HMO) | Source: Ambulatory Visit | Attending: Obstetrics and Gynecology | Admitting: Obstetrics and Gynecology

## 2022-01-05 DIAGNOSIS — Z1231 Encounter for screening mammogram for malignant neoplasm of breast: Secondary | ICD-10-CM | POA: Insufficient documentation

## 2022-01-18 ENCOUNTER — Encounter: Payer: Self-pay | Admitting: Obstetrics and Gynecology

## 2022-01-19 ENCOUNTER — Other Ambulatory Visit: Payer: Self-pay | Admitting: Obstetrics and Gynecology

## 2022-01-19 DIAGNOSIS — N898 Other specified noninflammatory disorders of vagina: Secondary | ICD-10-CM

## 2022-01-19 MED ORDER — DOXYCYCLINE HYCLATE 100 MG PO CAPS: 100.0000 mg | ORAL_CAPSULE | Freq: Two times a day (BID) | ORAL | 0 refills | Status: AC

## 2022-01-19 NOTE — Progress Notes (Signed)
Rx doxy for vulvar lesions c/w pimples.

## 2022-04-14 ENCOUNTER — Encounter: Payer: Self-pay | Admitting: Gastroenterology

## 2022-11-14 ENCOUNTER — Ambulatory Visit: Payer: Managed Care, Other (non HMO) | Admitting: Obstetrics and Gynecology

## 2023-01-04 ENCOUNTER — Other Ambulatory Visit: Payer: Self-pay | Admitting: Obstetrics and Gynecology

## 2023-01-04 DIAGNOSIS — N952 Postmenopausal atrophic vaginitis: Secondary | ICD-10-CM

## 2023-01-17 NOTE — Progress Notes (Unsigned)
PCP: Eather Colas, FNP   No chief complaint on file.   HPI:      Ms. Ruth Bennett is a 57 y.o. G0P0000 who LMP was No LMP recorded (lmp unknown). Patient is postmenopausal., presents today for her annual examination.  Her menses are absent due to menopause, no PMB. No dysmen. Improving vasomotor sx.   Sex activity: single partner, contraception - post menopause. She does have vaginal dryness, no longer improved with lubricants/coconut oil.  Last Pap: 11/08/21  Results were: no abnormalities /neg HPV DNA.  Hx of STDs: HPV on pap 2017  Pt has had a bump in vulvar area for a couple months. Has had in past but it usually resolves quickly, this one hasn't gone away. Tried a warm compress without relief. Used mupirocin oint with some improvement. Pt concerned; lesion significantly improved today. Also noticed a new one LT buttocks near gluteal cleft yesterday, improved today.  Last mammogram: 01/05/22 Results were: normal--routine follow-up in 12 months.  There is no FH of breast cancer. There is no FH of ovarian cancer. The patient does do self-breast exams.  Colonoscopy: colonoscopy 4+ years ago without abnormalities.  Dr. Chales Abrahams in [place]. Repeat due after 5 years. FH colon cancer in her PGM.  Tobacco use: The patient denies current or previous tobacco use. Alcohol use: social Drug use: none Exercise: moderately active  She does get adequate calcium and Vitamin D in her diet.  Labs with PCP.   Past Medical History:  Diagnosis Date   Cancer (HCC)    skin   Genital warts    Hemorrhoids    HPV (human papilloma virus) infection    Hypertension     Past Surgical History:  Procedure Laterality Date   COLONOSCOPY     WISDOM TOOTH EXTRACTION      Family History  Problem Relation Age of Onset   Transient ischemic attack Mother    Hypertension Father    Hypertension Sister    Thyroid cancer Sister    Colon cancer Paternal Grandmother    Stomach cancer Paternal  Grandmother    Breast cancer Neg Hx     Social History   Socioeconomic History   Marital status: Married    Spouse name: Not on file   Number of children: Not on file   Years of education: Not on file   Highest education level: Not on file  Occupational History   Not on file  Tobacco Use   Smoking status: Never   Smokeless tobacco: Never  Vaping Use   Vaping Use: Never used  Substance and Sexual Activity   Alcohol use: Yes   Drug use: Never   Sexual activity: Yes    Birth control/protection: None  Other Topics Concern   Not on file  Social History Narrative   Not on file   Social Determinants of Health   Financial Resource Strain: Not on file  Food Insecurity: Not on file  Transportation Needs: Not on file  Physical Activity: Not on file  Stress: Not on file  Social Connections: Not on file  Intimate Partner Violence: Not on file    Outpatient Medications Prior to Visit  Medication Sig Dispense Refill   estradiol (ESTRACE) 0.1 MG/GM vaginal cream INSERT 1G VAGINALLY NIGHTLY FOR 1 WK, THEN 1 G ONCE WEEKLY AS MAINTENACE 42.5 g 0   metoprolol succinate (TOPROL-XL) 50 MG 24 hr tablet Take 50 mg by mouth daily.     mupirocin ointment (BACTROBAN) 2 %  Apply topically 2 (two) times daily.     No facility-administered medications prior to visit.     ROS:  Review of Systems  Constitutional:  Negative for fatigue, fever and unexpected weight change.  Respiratory:  Negative for cough, shortness of breath and wheezing.   Cardiovascular:  Negative for chest pain, palpitations and leg swelling.  Gastrointestinal:  Negative for blood in stool, constipation, diarrhea, nausea and vomiting.  Endocrine: Negative for cold intolerance, heat intolerance and polyuria.  Genitourinary:  Positive for dyspareunia and genital sores. Negative for dysuria, flank pain, frequency, hematuria, menstrual problem, pelvic pain, urgency, vaginal bleeding, vaginal discharge and vaginal pain.   Musculoskeletal:  Negative for back pain, joint swelling and myalgias.  Skin:  Negative for rash.  Neurological:  Negative for dizziness, syncope, light-headedness, numbness and headaches.  Hematological:  Negative for adenopathy.  Psychiatric/Behavioral:  Negative for agitation, confusion, sleep disturbance and suicidal ideas. The patient is not nervous/anxious.   BREAST: No symptoms    Objective: LMP  (LMP Unknown)    Physical Exam Constitutional:      Appearance: She is well-developed.  Genitourinary:     Vulva normal.     Right Labia: lesions.     Right Labia: No rash or tenderness.    Left Labia: No tenderness, lesions or rash.    No vaginal discharge, erythema or tenderness.     Mild vaginal atrophy present.     Right Adnexa: not tender and no mass present.    Left Adnexa: not tender and no mass present.    No cervical motion tenderness, friability or polyp.     Uterus is not enlarged or tender.  Breasts:    Right: No mass, nipple discharge, skin change or tenderness.     Left: No mass, nipple discharge, skin change or tenderness.  Neck:     Thyroid: No thyromegaly.  Cardiovascular:     Rate and Rhythm: Normal rate and regular rhythm.     Heart sounds: Normal heart sounds. No murmur heard. Pulmonary:     Effort: Pulmonary effort is normal.     Breath sounds: Normal breath sounds.  Abdominal:     Palpations: Abdomen is soft.     Tenderness: There is no abdominal tenderness. There is no guarding or rebound.  Musculoskeletal:        General: Normal range of motion.     Cervical back: Normal range of motion.  Lymphadenopathy:     Cervical: No cervical adenopathy.  Neurological:     General: No focal deficit present.     Mental Status: She is alert and oriented to person, place, and time.     Cranial Nerves: No cranial nerve deficit.  Skin:    General: Skin is warm and dry.  Psychiatric:        Mood and Affect: Mood normal.        Behavior: Behavior normal.         Thought Content: Thought content normal.        Judgment: Judgment normal.  Vitals reviewed.     Assessment/Plan: Encounter for annual routine gynecological examination  Cervical cancer screening - Plan: Cytology - PAP  Screening for HPV (human papillomavirus) - Plan: Cytology - PAP  Encounter for screening mammogram for malignant neoplasm of breast - Plan: MM 3D SCREEN BREAST BILATERAL; pt to schedule mammo  Screening for colon cancer--pt to f/u with GI to see when due for colonoscopy. Will do ref prn.   Postmenopausal atrophic vaginitis -  Plan: estradiol (ESTRACE) 0.1 MG/GM vaginal cream; try vag ERT for dyspareunia/atrophy. Cont coconut oil. Rx estrace crm. F/u prn.   Vaginal lesion--resolving, reassurance. Recommended warm compresses/mupirocin prn. F/u prn.   No orders of the defined types were placed in this encounter.          GYN counsel breast self exam, mammography screening, menopause, adequate intake of calcium and vitamin D, diet and exercise    F/U  No follow-ups on file.  Tonantzin Mimnaugh B. Bonnee Zertuche, PA-C 01/17/2023 9:25 PM

## 2023-01-18 ENCOUNTER — Encounter: Payer: Self-pay | Admitting: Obstetrics and Gynecology

## 2023-01-18 ENCOUNTER — Ambulatory Visit (INDEPENDENT_AMBULATORY_CARE_PROVIDER_SITE_OTHER): Payer: Managed Care, Other (non HMO) | Admitting: Obstetrics and Gynecology

## 2023-01-18 VITALS — BP 120/80 | Ht 67.0 in | Wt 209.0 lb

## 2023-01-18 DIAGNOSIS — Z01419 Encounter for gynecological examination (general) (routine) without abnormal findings: Secondary | ICD-10-CM

## 2023-01-18 DIAGNOSIS — Z1211 Encounter for screening for malignant neoplasm of colon: Secondary | ICD-10-CM

## 2023-01-18 DIAGNOSIS — N952 Postmenopausal atrophic vaginitis: Secondary | ICD-10-CM

## 2023-01-18 DIAGNOSIS — Z1231 Encounter for screening mammogram for malignant neoplasm of breast: Secondary | ICD-10-CM

## 2023-01-18 MED ORDER — ESTRADIOL 0.1 MG/GM VA CREA: TOPICAL_CREAM | VAGINAL | 0 refills | Status: DC

## 2023-01-18 NOTE — Patient Instructions (Addendum)
I value your feedback and you entrusting [place] with your care. If you get a [facility] patient survey, I would appreciate you taking the time to let us know about your experience today. Thank you!  [facility] ([place])--[phone]

## 2023-02-09 ENCOUNTER — Encounter: Payer: Self-pay | Admitting: Gastroenterology

## 2023-02-13 ENCOUNTER — Ambulatory Visit (AMBULATORY_SURGERY_CENTER): Payer: Managed Care, Other (non HMO)

## 2023-02-13 VITALS — Ht 67.0 in | Wt 202.0 lb

## 2023-02-13 DIAGNOSIS — Z1211 Encounter for screening for malignant neoplasm of colon: Secondary | ICD-10-CM

## 2023-02-13 MED ORDER — NA SULFATE-K SULFATE-MG SULF 17.5-3.13-1.6 GM/177ML PO SOLN: 1.0000 | Freq: Once | ORAL | 0 refills | Status: AC

## 2023-02-13 NOTE — Progress Notes (Addendum)
No egg or soy allergy known to patient  No issues known to pt with past sedation with any surgeries or procedures Patient denies ever being told they had issues or difficulty with intubation  No FH of Malignant Hyperthermia Pt is not on diet pills Pt is not on  home 02  Pt is not on blood thinners  Pt denies issues with constipation takes fiber daily instructions given for extra miralax  No A fib or A flutter Have any cardiac testing pending--no  LOA: independent   PV completed. Prep reviewed with patient sent via mychart and to home address. Good rx coupon for [facility] provided. Pt to use Singlecare.com or GoodRx for a price reduction on prep if needed.

## 2023-02-15 ENCOUNTER — Encounter: Payer: Self-pay | Admitting: Gastroenterology

## 2023-03-14 ENCOUNTER — Encounter: Payer: Self-pay | Admitting: Gastroenterology

## 2023-03-14 ENCOUNTER — Ambulatory Visit: Payer: Managed Care, Other (non HMO) | Admitting: Gastroenterology

## 2023-03-14 VITALS — BP 111/73 | HR 60 | Temp 97.5°F | Resp 18 | Ht 67.0 in | Wt 202.0 lb

## 2023-03-14 DIAGNOSIS — Z1211 Encounter for screening for malignant neoplasm of colon: Secondary | ICD-10-CM | POA: Diagnosis not present

## 2023-03-14 MED ORDER — SODIUM CHLORIDE 0.9 % IV SOLN
500.0000 mL | Freq: Once | INTRAVENOUS | Status: DC
Start: 2023-03-14 — End: 2023-03-14

## 2023-03-14 NOTE — Progress Notes (Signed)
 Report to PACU, RN, vss, BBS= Clear.  

## 2023-03-14 NOTE — Progress Notes (Signed)
 VS by DT  Pt's states no medical or surgical changes since previsit or office visit.  

## 2023-03-14 NOTE — Progress Notes (Signed)
Gastroenterology History and Physical   Primary Care Physician:  Eather Colas, FNP   Reason for Procedure:  Colorectal cancer screening  Plan:    Colonoscopy     HPI: Ruth Bennett is a 57 y.o. female    Past Medical History:  Diagnosis Date   Cancer (HCC)    skin   Genital warts    Hemorrhoids    HPV (human papilloma virus) infection    Hypertension     Past Surgical History:  Procedure Laterality Date   COLONOSCOPY  03/05/2017   Small internal hemorrhoids. Otherwise normal colonosopy   WISDOM TOOTH EXTRACTION      Prior to Admission medications   Medication Sig Start Date End Date Taking? Authorizing Provider  Cholecalciferol (VITAMIN D-3) 25 MCG (1000 UT) CAPS Take 1 capsule by mouth daily.   Yes [provider]  esomeprazole (NEXIUM) 20 MG capsule Take 20 mg by mouth every other day.   Yes [provider]  estradiol (ESTRACE) 0.1 MG/GM vaginal cream Insert 1 g weekly as maintenance 01/18/23  Yes Copland, Alicia B, PA-C  FIBER PO Take 1 tablet by mouth daily.   Yes [provider]  metoprolol succinate (TOPROL-XL) 50 MG 24 hr tablet Take 50 mg by mouth daily. 07/29/20  Yes [provider]    Current Outpatient Medications  Medication Sig Dispense Refill   Cholecalciferol (VITAMIN D-3) 25 MCG (1000 UT) CAPS Take 1 capsule by mouth daily.     esomeprazole (NEXIUM) 20 MG capsule Take 20 mg by mouth every other day.     estradiol (ESTRACE) 0.1 MG/GM vaginal cream Insert 1 g weekly as maintenance 42.5 g 0   FIBER PO Take 1 tablet by mouth daily.     metoprolol succinate (TOPROL-XL) 50 MG 24 hr tablet Take 50 mg by mouth daily.     No current facility-administered medications for this visit.    Allergies as of 03/14/2023   (No Known Allergies)    Family History  Problem Relation Age of Onset   Transient ischemic attack Mother    Hypertension Father    Hypertension Sister    Thyroid cancer Sister    Colon  cancer Paternal Grandmother    Stomach cancer Paternal Grandmother    Breast cancer Neg Hx    Colon polyps Neg Hx    Esophageal cancer Neg Hx    Rectal cancer Neg Hx     Social History   Socioeconomic History   Marital status: Married    Spouse name: Not on file   Number of children: Not on file   Years of education: Not on file   Highest education level: Not on file  Occupational History   Not on file  Tobacco Use   Smoking status: Never   Smokeless tobacco: Never  Vaping Use   Vaping status: Never Used  Substance and Sexual Activity   Alcohol use: Yes   Drug use: Never   Sexual activity: Yes    Birth control/protection: None, Post-menopausal  Other Topics Concern   Not on file  Social History Narrative   Not on file   Social Determinants of Health   Financial Resource Strain: Low Risk  (01/25/2022)   [facility]/28/[facility]22[facility]   Overall Financial Resource Strain (CARDIA)    Difficulty of Paying Living Expenses: Not very hard  Food Insecurity: Low Risk  (12/29/2022)   [facility]   Food vital sign    Within the past 12 months, you worried that your food would run out before you got money to buy more: Never true    Within the past 12 months, the food you bought just didn't last and you didn't have money to get more. : Never true  Transportation Needs: Not on file (12/29/2022)  Physical Activity: Insufficiently Active (01/25/2022)   [facility]   Exercise Vital Sign    Days of Exercise per Week: 3 days    Minutes of Exercise per Session: 30 min  Stress: No Stress Concern Present (01/25/2022)   [facility]28[facility]   [employer] of Occupational Health - Occupational Stress Questionnaire     Feeling of Stress : Not at all  Social Connections: Socially Integrated (01/25/2022)   [facility] to [facility]28[facility]2022[facility]   Social Connection and Isolation Panel [NHANES]    Frequency of Communication with Friends and Family: Twice a week    Frequency of Social Gatherings with Friends and Family: Once a week    Attends Religious Services: More than 4 times per year    Active Member of [employer] or Organizations: Yes    Attends Banker Meetings: 1 to 4 times per year    Marital Status: Married  Catering manager Violence: Not At Risk (01/25/2022)   [facility] visits prior to 10/28/2022.   Humiliation, Afraid, Rape, and Kick questionnaire    Fear of Current or Ex-Partner: No    Emotionally Abused: No    Physically Abused: No    Sexually Abused: No    Review of Systems: Positive for none All other review of systems negative except as mentioned in the HPI.  Physical Exam: Vital signs in last 24 hours: @VSRANGES @   General:   Alert,  Well-developed, well-nourished, pleasant and cooperative in NAD Lungs:  Clear throughout to auscultation.   Heart:  Regular rate and rhythm; no murmurs, clicks, rubs,  or gallops. Abdomen:  Soft, nontender and nondistended. Normal bowel sounds.   Neuro/Psych:  Alert and cooperative. Normal mood and affect. A and O x 3    No significant changes were identified.  The patient continues to be an appropriate candidate for the planned procedure and anesthesia.   Edman Circle, MD. [facility] 03/14/2023 3:34 PM@

## 2023-03-14 NOTE — Op Note (Signed)
[facility] Patient Name: Ruth Bennett Procedure Date: 03/14/2023 2:29 PM MRN: [id number] Endoscopist: Lynann Bologna , MD, [id number] Age: 57 Referring MD:  Date of Birth: 07-13-1966 Gender: Female Account #: [id number] Procedure:                Colonoscopy Indications:              Screening for colorectal malignant neoplasm Medicines:                Monitored Anesthesia Care Procedure:                Pre-Anesthesia Assessment:                           - Prior to the procedure, a History and Physical                            was performed, and patient medications and                            allergies were reviewed. The patient's tolerance of                            previous anesthesia was also reviewed. The risks                            and benefits of the procedure and the sedation                            options and risks were discussed with the patient.                            All questions were answered, and informed consent                            was obtained. Prior Anticoagulants: The patient has                            taken no anticoagulant or antiplatelet agents. ASA                            Grade Assessment: I - A normal, healthy patient.                            After reviewing the risks and benefits, the patient                            was deemed in satisfactory condition to undergo the                            procedure.                           After obtaining informed consent, the colonoscope  was passed under direct vision. Throughout the                            procedure, the patient's blood pressure, pulse, and                            oxygen saturations were monitored continuously. The                            Olympus CF-HQ190L ([id number]) Colonoscope was                            introduced through the anus and advanced to the 2                            cm into the ileum. The  colonoscopy was performed                            without difficulty. The patient tolerated the                            procedure well. The quality of the bowel                            preparation was good. The terminal ileum, ileocecal                            valve, appendiceal orifice, and rectum were                            photographed. Scope In: 2:35:35 PM Scope Out: 2:48:24 PM Scope Withdrawal Time: 0 hours 8 minutes 42 seconds  Total Procedure Duration: 0 hours 12 minutes 49 seconds  Findings:                 The colon (entire examined portion) appeared                            normal. The sigmoid colon was somewhat tortuous.                           The terminal ileum appeared normal.                           Non-bleeding internal hemorrhoids were found during                            retroflexion. The hemorrhoids were small and Grade                            I (internal hemorrhoids that do not prolapse).                           The exam was otherwise without abnormality on  direct and retroflexion views. Complications:            No immediate complications. Estimated Blood Loss:     Estimated blood loss: none. Impression:               - The entire examined colon is normal.                           - The examined portion of the ileum was normal.                           - Non-bleeding internal hemorrhoids.                           - The examination was otherwise normal on direct                            and retroflexion views.                           - No specimens collected. Recommendation:           - Patient has a contact number available for                            emergencies. The signs and symptoms of potential                            delayed complications were discussed with the                            patient. Return to normal activities tomorrow.                            Written discharge instructions  were provided to the                            patient.                           - Resume previous diet.                           - Continue present medications.                           - Repeat colonoscopy in 10 years for screening                            purposes. Earlier, if with any new problems or                            change in family history.                           - The findings and recommendations were discussed  with the patient's family. Lynann Bologna, MD 03/14/2023 2:52:07 PM This report has been signed electronically.

## 2023-03-14 NOTE — Patient Instructions (Signed)
Handout on hemorrhoids given to patient Resume previous diet and continue present medications Repeat colonoscopy in 10 years for surveillance, earlier if new symptoms or change in family history    YOU HAD AN ENDOSCOPIC PROCEDURE TODAY AT THE [facility]:   Refer to the procedure report that was given to you for any specific questions about what was found during the examination.  If the procedure report does not answer your questions, please call your gastroenterologist to clarify.  If you requested that your care partner not be given the details of your procedure findings, then the procedure report has been included in a sealed envelope for you to review at your convenience later.  YOU SHOULD EXPECT: Some feelings of bloating in the abdomen. Passage of more gas than usual.  Walking can help get rid of the air that was put into your GI tract during the procedure and reduce the bloating. If you had a lower endoscopy (such as a colonoscopy or flexible sigmoidoscopy) you may notice spotting of blood in your stool or on the toilet paper. If you underwent a bowel prep for your procedure, you may not have a normal bowel movement for a few days.  Please Note:  You might notice some irritation and congestion in your nose or some drainage.  This is from the oxygen used during your procedure.  There is no need for concern and it should clear up in a day or so.  SYMPTOMS TO REPORT IMMEDIATELY:  Following lower endoscopy (colonoscopy or flexible sigmoidoscopy):  Excessive amounts of blood in the stool  Significant tenderness or worsening of abdominal pains  Swelling of the abdomen that is new, acute  Fever of 100F or higher  For urgent or emergent issues, a gastroenterologist can be reached at any hour by calling [phone]. Do not use MyChart messaging for urgent concerns.    DIET:  We do recommend a small meal at first, but then you may proceed to your regular diet.  Drink plenty of  fluids but you should avoid alcoholic beverages for 24 hours.  ACTIVITY:  You should plan to take it easy for the rest of today and you should NOT DRIVE or use heavy machinery until tomorrow (because of the sedation medicines used during the test).    FOLLOW UP: Our staff will call the number listed on your records the next business day following your procedure.  We will call around 7:15- 8:00 am to check on you and address any questions or concerns that you may have regarding the information given to you following your procedure. If we do not reach you, we will leave a message.     If any biopsies were taken you will be contacted by phone or by letter within the next 1-3 weeks.  Please call us at [phone] if you have not heard about the biopsies in 3 weeks.    SIGNATURES/CONFIDENTIALITY: You and/or your care partner have signed paperwork which will be entered into your electronic medical record.  These signatures attest to the fact that that the information above on your After Visit Summary has been reviewed and is understood.  Full responsibility of the confidentiality of this discharge information lies with you and/or your care-partner.

## 2023-03-15 ENCOUNTER — Telehealth: Payer: Self-pay | Admitting: *Deleted

## 2023-03-15 NOTE — Telephone Encounter (Signed)
  Follow up Call-     03/14/2023    2:07 PM  Call back number  Post procedure Call Back phone  # [phone]  Permission to leave phone message Yes     Patient questions:  Do you have a fever, pain , or abdominal swelling? No. Pain Score  0 *  Have you tolerated food without any problems? Yes.    Have you been able to return to your normal activities? Yes.    Do you have any questions about your discharge instructions: Diet   No. Medications  No. Follow up visit  No.  Do you have questions or concerns about your Care? No.  Actions: * If pain score is 4 or above: No action needed, pain <4.

## 2023-05-03 ENCOUNTER — Ambulatory Visit
Admission: RE | Admit: 2023-05-03 | Discharge: 2023-05-03 | Disposition: A | Discharge status: Home / Self Care | Payer: Managed Care, Other (non HMO) | Source: Ambulatory Visit | Attending: Obstetrics and Gynecology | Admitting: Obstetrics and Gynecology

## 2023-05-03 DIAGNOSIS — Z1231 Encounter for screening mammogram for malignant neoplasm of breast: Secondary | ICD-10-CM | POA: Insufficient documentation

## 2023-10-12 IMAGING — MG MM DIGITAL SCREENING BILAT W/ TOMO AND CAD
6 of 12 series · 6 of 36 positions shown · non-contrast
Comparison: Previous exam(s).

CLINICAL DATA: Screening.

EXAM:
DIGITAL SCREENING BILATERAL MAMMOGRAM WITH TOMOSYNTHESIS AND CAD
TECHNIQUE: Bilateral screening digital craniocaudal and mediolateral oblique
mammograms were obtained. Bilateral screening digital breast
tomosynthesis was performed. The images were evaluated with
computer-aided detection.

[R MLO synth-2D (1 of 2)]
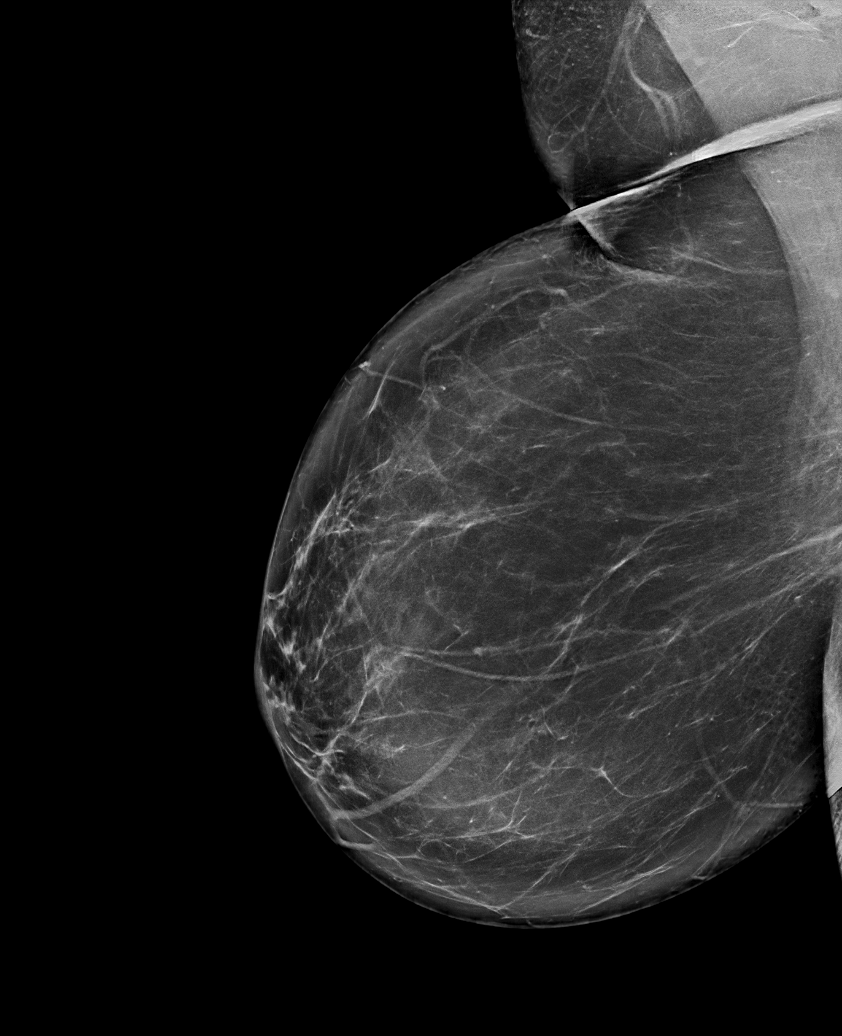

[R MLO synth-2D (2 of 2)]
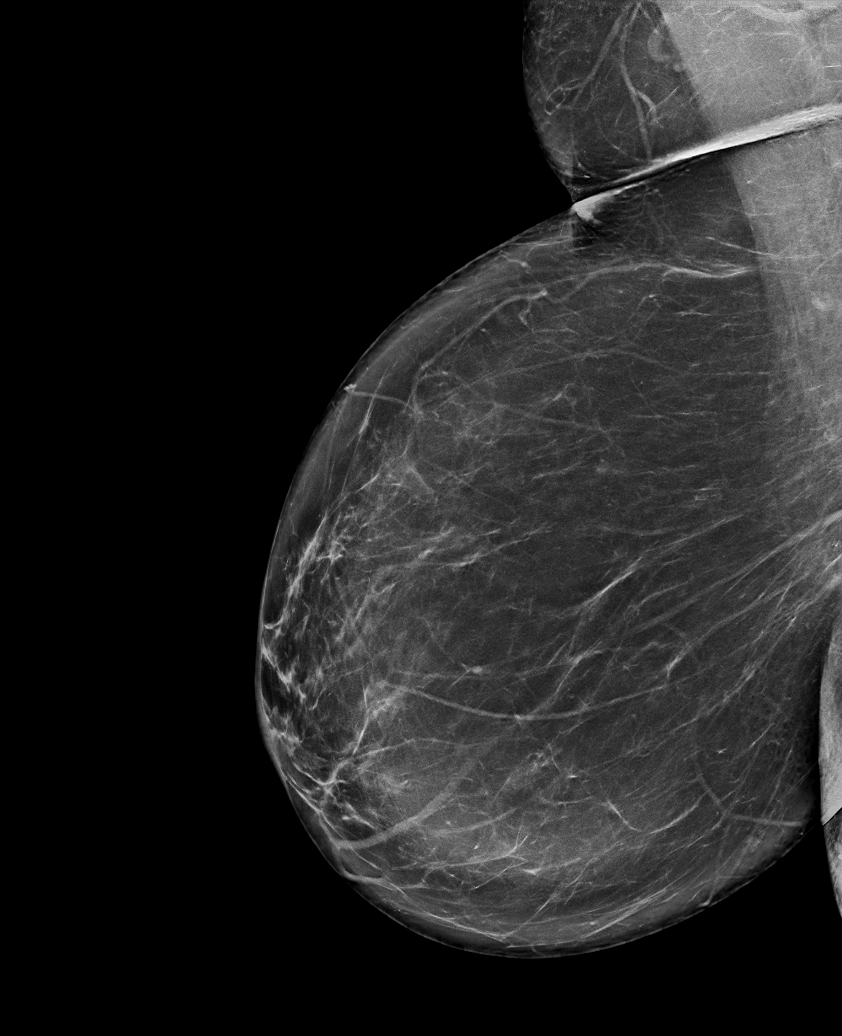

[L MLO synth-2D]
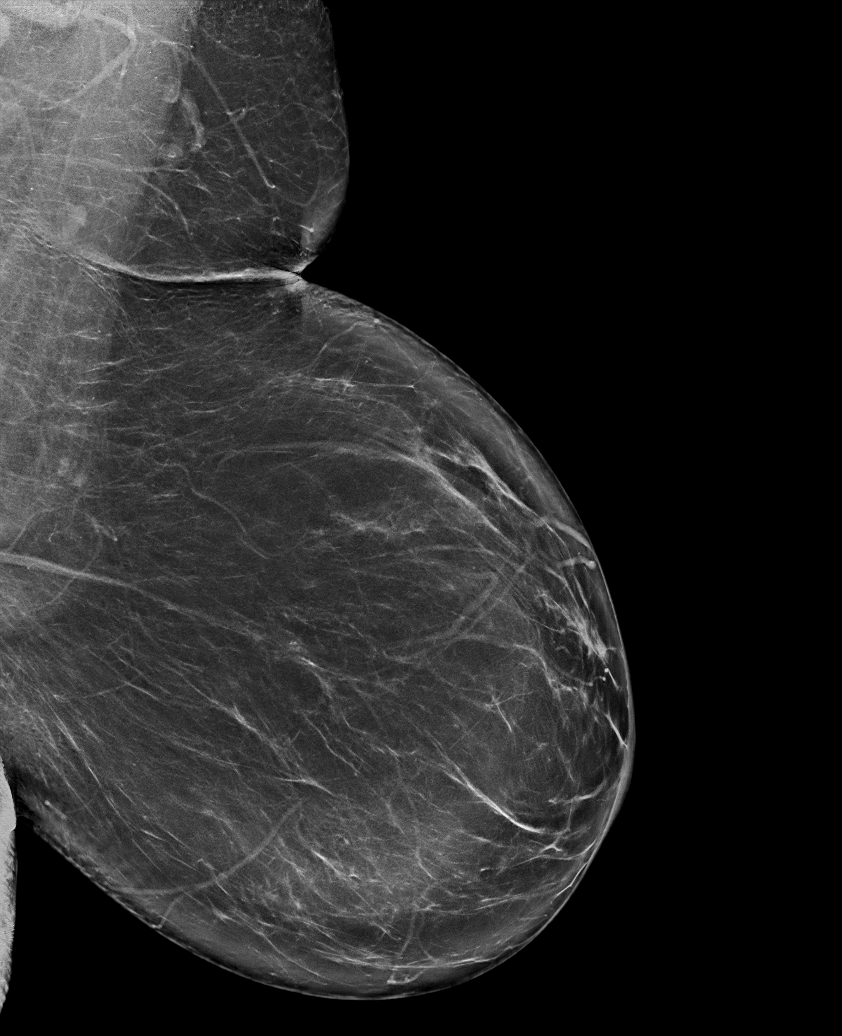

[R CC synth-2D]
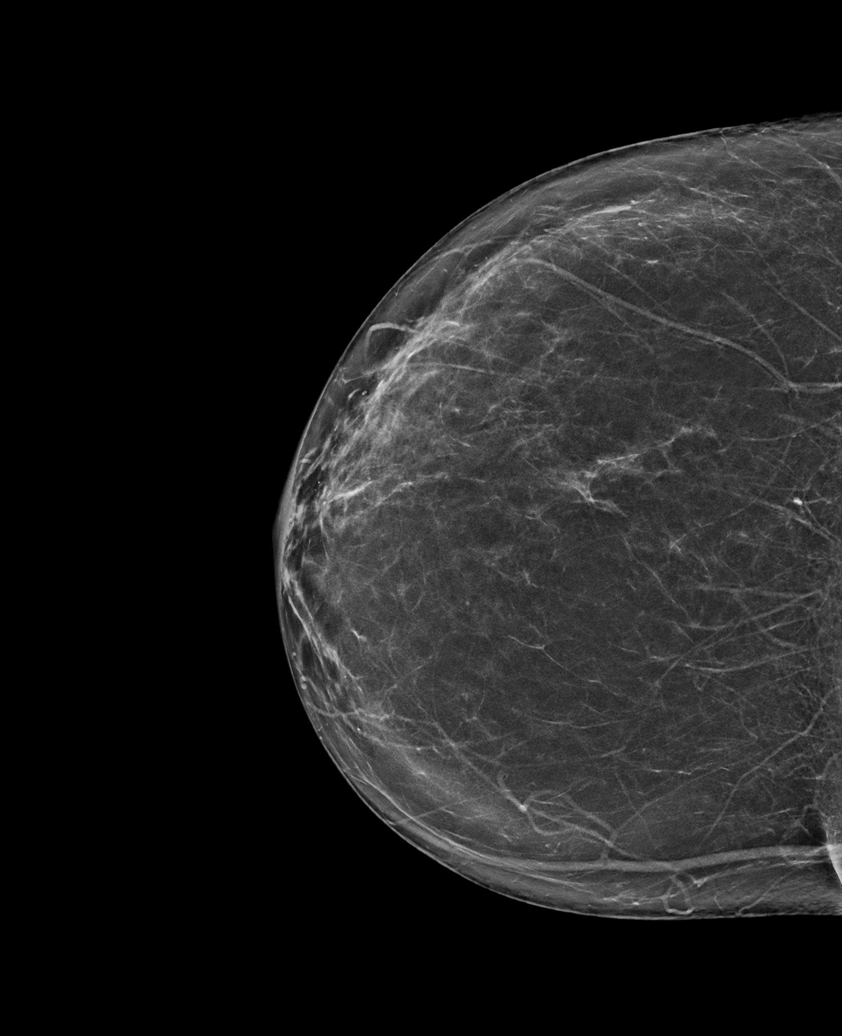

[L XCCL synth-2D]
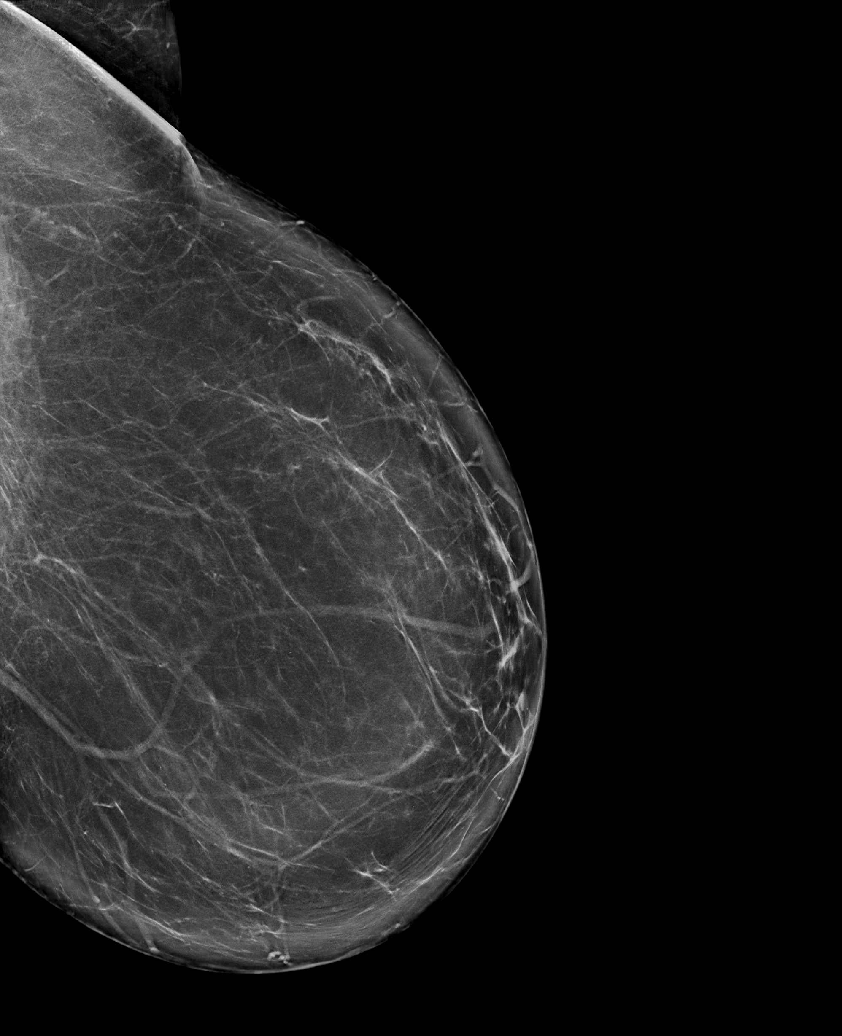

[L CC synth-2D]
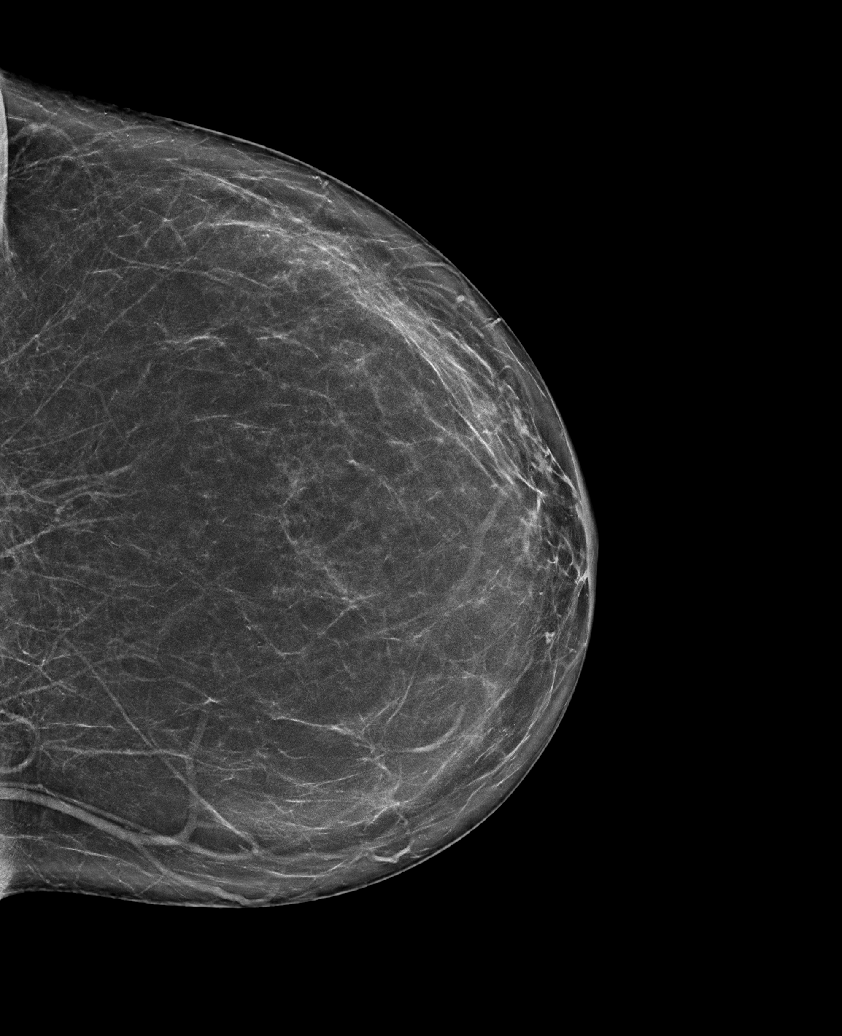

[6 of 36 positions shown; findings below may reference images not displayed]

ACR Breast Density Category b: There are scattered areas of
fibroglandular density.
FINDINGS: There are no findings suspicious for malignancy.
IMPRESSION: No mammographic evidence of malignancy. A result letter of this
screening mammogram will be mailed directly to the patient.

RECOMMENDATION:
Screening mammogram in one year. (Code:51-O-LD2)

BI-RADS CATEGORY  1: Negative.

## 2024-06-20 ENCOUNTER — Other Ambulatory Visit: Payer: Self-pay | Admitting: Obstetrics and Gynecology

## 2024-06-20 DIAGNOSIS — Z1231 Encounter for screening mammogram for malignant neoplasm of breast: Secondary | ICD-10-CM

## 2024-07-15 ENCOUNTER — Ambulatory Visit: Admitting: Obstetrics and Gynecology

## 2024-07-30 ENCOUNTER — Ambulatory Visit: Admitting: Obstetrics and Gynecology

## 2024-07-30 ENCOUNTER — Encounter: Payer: Self-pay | Admitting: Obstetrics and Gynecology

## 2024-07-30 VITALS — BP 115/77 | HR 76 | Ht 67.0 in | Wt 209.0 lb

## 2024-07-30 DIAGNOSIS — Z1231 Encounter for screening mammogram for malignant neoplasm of breast: Secondary | ICD-10-CM

## 2024-07-30 DIAGNOSIS — Z01419 Encounter for gynecological examination (general) (routine) without abnormal findings: Secondary | ICD-10-CM

## 2024-07-30 DIAGNOSIS — Z01411 Encounter for gynecological examination (general) (routine) with abnormal findings: Secondary | ICD-10-CM

## 2024-07-30 DIAGNOSIS — N952 Postmenopausal atrophic vaginitis: Secondary | ICD-10-CM | POA: Diagnosis not present

## 2024-07-30 MED ORDER — ESTRADIOL 0.01 % VA CREA: TOPICAL_CREAM | VAGINAL | 1 refills | Status: AC

## 2024-07-30 NOTE — Patient Instructions (Signed)
 I value your feedback and you entrusting [place] with your care. If you get a [place] patient survey, I would appreciate you taking the time to let us know about your experience today. Thank you! ? ? ?

## 2024-07-30 NOTE — Progress Notes (Signed)
 PCP: Katrinka Duwaine LABOR, FNP   Chief Complaint  Patient presents with   Gynecologic Exam    No concerns    HPI:      Ms. Ruth Bennett is a 58 y.o. G0P0000 who LMP was No LMP recorded (lmp unknown). Patient is postmenopausal., presents today for her annual examination.  Her menses are absent due to menopause, no PMB/pelvic pain. No dysmen. Occas vasomotor sx.   Sex activity: single partner, contraception - post menopause. She does have vaginal dryness, improved with vaginal ERT and lubricants. Doesn't always use wkly but does work if she does.   Last Pap: 11/08/21  Results were: no abnormalities /neg HPV DNA.  Hx of STDs: HPV on pap 2017; genital warts  Last mammogram: 05/03/23 Results were: normal--routine follow-up in 12 months. Has appt 12/25 There is no FH of breast cancer. There is no FH of ovarian cancer. The patient does self-breast exams.  Colonoscopy: colonoscopy 2024 or 2025,  without abnormalities.  Dr. Charlanne in [place]. Repeat due after 5 years (pt thinks). FH colon cancer in her PGM.   Tobacco use: The patient denies current or previous tobacco use. Alcohol use: social Drug use: none Exercise: min active  She does get adequate calcium and Vitamin D in her diet.  Labs with PCP. Having issues with arthritis in back and scoliosis, seeing chiro. Has limited exercise options currently.    Past Medical History:  Diagnosis Date   Cancer (HCC)    skin   Genital warts    Hemorrhoids    HPV (human papilloma virus) infection    Hypertension     Past Surgical History:  Procedure Laterality Date   COLONOSCOPY  03/05/2017   Small internal hemorrhoids. Otherwise normal colonosopy   WISDOM TOOTH EXTRACTION      Family History  Problem Relation Age of Onset   Transient ischemic attack Mother    Hypertension Father    Kidney cancer Father    Hypertension Sister    Thyroid cancer Sister    Colon cancer Paternal Grandmother    Stomach cancer Paternal  Grandmother    Breast cancer Neg Hx    Colon polyps Neg Hx    Esophageal cancer Neg Hx    Rectal cancer Neg Hx     Social History   Socioeconomic History   Marital status: Married    Spouse name: Not on file   Number of children: Not on file   Years of education: Not on file   Highest education level: Not on file  Occupational History   Not on file  Tobacco Use   Smoking status: Never   Smokeless tobacco: Never  Vaping Use   Vaping status: Never Used  Substance and Sexual Activity   Alcohol use: Yes    Comment: occ   Drug use: Never   Sexual activity: Yes    Birth control/protection: Post-menopausal  Other Topics Concern   Not on file  Social History Narrative   Not on file   Social Drivers of Health   Financial Resource Strain: Low Risk  (01/25/2022)   [facility] visits prior to 10/28/2022.   Overall Financial Resource Strain (CARDIA)    Difficulty of Paying Living Expenses: Not very hard  Food Insecurity: Low Risk  (12/29/2022)   Received from [facility]   Hunger Vital Sign    Within the past 12 months, you worried that your food would run out before you  got money to buy more: Never true    Within the past 12 months, the food you bought just didn't last and you didn't have money to get more. : Never true  Transportation Needs: Not on file (12/29/2022)  Physical Activity: Insufficiently Active (01/25/2022)   Received from [facility] visits prior to 10/28/2022., [facility]   Exercise Vital Sign    On average, how many days per week do you engage in moderate to strenuous exercise (like a brisk walk)?: 3 days    On average, how many minutes do you engage in exercise at this level?: 30 min  Stress: No Stress Concern Present (01/25/2022)   Received from [facility] visits prior to 10/28/2022., [facility]   [employer] of Occupational Health - Occupational Stress  Questionnaire    Feeling of Stress : Not at all  Social Connections: Socially Integrated (01/25/2022)   Received [facility] prior to 10/28/2022., [facility]   Social Connection and Isolation Panel    In a typical week, how many times do you talk on the phone with family, friends, or neighbors?: Twice a week    How often do you get together with friends or relatives?: Once a week    How often do you attend church or religious services?: More than 4 times per year    Do you belong to any clubs or organizations such as church groups, unions, fraternal or athletic groups, or school groups?: Yes    How often do you attend meetings of the clubs or organizations you belong to?: 1 to 4 times per year    Are you married, widowed, divorced, separated, never married, or living with a partner?: Married  Intimate Partner Violence: Not At Risk (01/25/2022)   Received from [facility] visits prior to 10/28/2022.   Humiliation, Afraid, Rape, and Kick questionnaire    Within the last year, have you been afraid of your partner or ex-partner?: No    Within the last year, have you been humiliated or emotionally abused in other ways by your partner or ex-partner?: No    Within the last year, have you been kicked, hit, slapped, or otherwise physically hurt by your partner or ex-partner?: No    Within the last year, have you been raped or forced to have any kind of sexual activity by your partner or ex-partner?: No    Outpatient Medications Prior to Visit  Medication Sig Dispense Refill   Cholecalciferol (VITAMIN D-3) 25 MCG (1000 UT) CAPS Take 1 capsule by mouth daily.     esomeprazole (NEXIUM) 20 MG capsule Take 20 mg by mouth every other day.     FIBER PO Take 1 tablet by mouth daily.     metoprolol succinate (TOPROL-XL) 50 MG 24 hr tablet Take 50 mg by mouth daily.     estradiol  (ESTRACE ) 0.1 MG/GM vaginal cream Insert 1 g weekly as maintenance 42.5 g 0    No facility-administered medications prior to visit.     ROS:  Review of Systems  Constitutional:  Negative for fatigue, fever and unexpected weight change.  Respiratory:  Negative for cough, shortness of breath and wheezing.   Cardiovascular:  Negative for chest pain, palpitations and leg swelling.  Gastrointestinal:  Negative for blood in stool, constipation, diarrhea, nausea and vomiting.  Endocrine: Negative for cold intolerance, heat intolerance and polyuria.  Genitourinary:  Negative for dyspareunia, dysuria, flank pain, frequency,  genital sores, hematuria, menstrual problem, pelvic pain, urgency, vaginal bleeding, vaginal discharge and vaginal pain.  Musculoskeletal:  Negative for back pain, joint swelling and myalgias.  Skin:  Negative for rash.  Neurological:  Negative for dizziness, syncope, light-headedness, numbness and headaches.  Hematological:  Negative for adenopathy.  Psychiatric/Behavioral:  Negative for agitation, confusion, sleep disturbance and suicidal ideas. The patient is not nervous/anxious.   BREAST: No symptoms    Objective: BP 115/77   Pulse 76   Ht 5' 7 (1.702 m)   Wt 209 lb (94.8 kg)   LMP  (LMP Unknown)   BMI 32.73 kg/m    Physical Exam Constitutional:      Appearance: She is well-developed.  Genitourinary:     Vulva normal.     Right Labia: No rash, tenderness or lesions.    Left Labia: No tenderness, lesions or rash.    No vaginal discharge, erythema or tenderness.      Right Adnexa: not tender and no mass present.    Left Adnexa: not tender and no mass present.    No cervical friability or polyp.     Uterus is not enlarged or tender.  Breasts:    Right: No mass, nipple discharge, skin change or tenderness.     Left: No mass, nipple discharge, skin change or tenderness.  Neck:     Thyroid: No thyromegaly.  Cardiovascular:     Rate and Rhythm: Normal rate and regular rhythm.     Heart sounds: Normal heart sounds. No murmur  heard. Pulmonary:     Effort: Pulmonary effort is normal.     Breath sounds: Normal breath sounds.  Abdominal:     Palpations: Abdomen is soft.     Tenderness: There is no abdominal tenderness. There is no guarding or rebound.  Musculoskeletal:        General: Normal range of motion.     Cervical back: Normal range of motion.  Lymphadenopathy:     Cervical: No cervical adenopathy.  Neurological:     General: No focal deficit present.     Mental Status: She is alert and oriented to person, place, and time.     Cranial Nerves: No cranial nerve deficit.  Skin:    General: Skin is warm and dry.  Psychiatric:        Mood and Affect: Mood normal.        Behavior: Behavior normal.        Thought Content: Thought content normal.        Judgment: Judgment normal.  Vitals reviewed.     Assessment/Plan: Encounter for annual routine gynecological examination  Encounter for screening mammogram for malignant neoplasm of breast; pt has mammo appt  Postmenopausal atrophic vaginitis - Plan: estradiol  (ESTRACE ) 0.01 % CREA vaginal cream; Rx RF eRxd, doing well.    Meds ordered this encounter  Medications   estradiol  (ESTRACE ) 0.01 % CREA vaginal cream    Sig: Insert 1 g vaginally weekly as maintenance    Dispense:  42.5 g    Refill:  1    Supervising Provider:   ROBY, MICIA [[id number]]           GYN counsel breast self exam, mammography screening, menopause, adequate intake of calcium and vitamin D, diet and exercise    F/U  Return in about 1 year (around 07/30/2025).  Alvah Lagrow B. Tabatha Razzano, PA-C 07/30/2024 2:14 PM

## 2024-08-01 ENCOUNTER — Ambulatory Visit
Admission: RE | Admit: 2024-08-01 | Discharge: 2024-08-01 | Disposition: A | Source: Ambulatory Visit | Attending: Obstetrics and Gynecology

## 2024-08-01 DIAGNOSIS — Z1231 Encounter for screening mammogram for malignant neoplasm of breast: Secondary | ICD-10-CM

## 2024-08-11 ENCOUNTER — Ambulatory Visit: Payer: Self-pay | Admitting: Obstetrics and Gynecology
